# Patient Record
Sex: Female | Born: 2000 | Race: Black or African American | Hispanic: No | Marital: Single | Smoking: Current some day smoker
Health system: Southern US, Community
[De-identification: ages and names within clinical notes are randomized; demographics above are authoritative.]

## PROBLEM LIST (undated history)

## (undated) DIAGNOSIS — Z789 Other specified health status: Secondary | ICD-10-CM

## (undated) HISTORY — PX: NO PAST SURGERIES: SHX2092

---
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
## 2018-03-29 ENCOUNTER — Encounter: Payer: Self-pay | Admitting: Emergency Medicine

## 2018-03-29 ENCOUNTER — Emergency Department
Admission: EM | Admit: 2018-03-29 | Discharge: 2018-03-29 | Disposition: A | Discharge status: Home / Self Care | Payer: Medicaid Other | Attending: Emergency Medicine | Admitting: Emergency Medicine

## 2018-03-29 ENCOUNTER — Other Ambulatory Visit: Payer: Self-pay

## 2018-03-29 DIAGNOSIS — J189 Pneumonia, unspecified organism: Secondary | ICD-10-CM | POA: Insufficient documentation

## 2018-03-29 DIAGNOSIS — M791 Myalgia, unspecified site: Secondary | ICD-10-CM | POA: Diagnosis not present

## 2018-03-29 DIAGNOSIS — R05 Cough: Secondary | ICD-10-CM | POA: Diagnosis not present

## 2018-03-29 DIAGNOSIS — R509 Fever, unspecified: Secondary | ICD-10-CM | POA: Diagnosis present

## 2018-03-29 LAB — INFLUENZA PANEL BY PCR (TYPE A & B)
INFLAPCR: NEGATIVE
INFLBPCR: NEGATIVE

## 2018-03-29 MED ORDER — AZITHROMYCIN 250 MG PO TABS: ORAL_TABLET | ORAL | 0 refills | Status: AC

## 2018-03-29 MED ORDER — PSEUDOEPH-BROMPHEN-DM 30-2-10 MG/5ML PO SYRP: 10.0000 mL | ORAL_SOLUTION | Freq: Four times a day (QID) | ORAL | 0 refills | Status: DC | PRN

## 2018-03-29 NOTE — ED Notes (Signed)
Sick for 2 weeks with c/o of body ache that began yesterday. Here today to r/o flu.

## 2018-03-29 NOTE — ED Triage Notes (Signed)
Cough and fever x 2 weeks 

## 2018-03-29 NOTE — ED Provider Notes (Signed)
[facility] Emergency Department Provider Note  ____________________________________________  Time seen: Approximately 8:25 PM  I have reviewed the triage vital signs and the nursing notes.   HISTORY  Chief Complaint Cough    HPI Yolanda Sutton is a 18 y.o. female who presents the emergency department complaining of fever, cough, body aches.  Patient had viral URI symptoms approximately 2 weeks ago that lasted for a week.  Patient seemed to improve and then developed cough and worsening symptoms.  No difficulty breathing.  No headache, neck pain or stiffness, chest pain, shortness of breath, abdominal pain, nausea vomiting, diarrhea or constipation.  No medications for his complaint currently.  No other complaints at this time.  Patient does have a history of sepsis from pyelonephritis.  No complaints with urinary tract at this time.    Past Medical History:  Diagnosis Date  . Patient denies medical problems     Patient Active Problem List   Diagnosis Date Noted  . Sepsis (HCC) 09/11/2015  . Pyelonephritis 09/11/2015  . Acute pyelonephritis 09/11/2015    Past Surgical History:  Procedure Laterality Date  . NO PAST SURGERIES      Prior to Admission medications   Medication Sig Start Date End Date Taking? Authorizing Provider  amoxicillin (AMOXIL) 500 MG tablet Take 1 tablet (500 mg total) by mouth 2 (two) times daily. 12/09/15   Beers, Charmayne Sheer, PA-C  azithromycin (ZITHROMAX Z-PAK) 250 MG tablet Take 2 tablets (500 mg) on  Day 1,  followed by 1 tablet (250 mg) once daily on Days 2 through 5. 03/29/18   Kadar Chance, Delorise Royals, PA-C  brompheniramine-pseudoephedrine-DM 30-2-10 MG/5ML syrup Take 10 mLs by mouth 4 (four) times daily as needed. 03/29/18   Cynthia Cogle, Delorise Royals, PA-C  diphenhydrAMINE (BENADRYL) 25 mg capsule Take 1 capsule (25 mg total) by mouth every 4 (four) hours as needed. 07/27/16 07/27/17  Little, Jordan Likes, PA-C    Allergies Patient has no  known allergies.  No family history on file.  Social History Social History   Tobacco Use  . Smoking status: Never Smoker  . Smokeless tobacco: Never Used  Substance Use Topics  . Alcohol use: No  . Drug use: No     Review of Systems  Constitutional: Subjective fever/chills Eyes: No visual changes. No discharge ENT: Minimal nasal congestion Cardiovascular: no chest pain. Respiratory: Positive cough. No SOB. Gastrointestinal: No abdominal pain.  No nausea, no vomiting.  No diarrhea.  No constipation. Musculoskeletal: Negative for musculoskeletal pain. Skin: Negative for rash, abrasions, lacerations, ecchymosis. Neurological: Negative for headaches, focal weakness or numbness. 10-point ROS otherwise negative.  ____________________________________________   PHYSICAL EXAM:  VITAL SIGNS: ED Triage Vitals  Enc Vitals Group     BP 03/29/18 1703 137/72     Pulse Rate 03/29/18 1703 (!) 110     Resp 03/29/18 1703 20     Temp 03/29/18 1703 99.2 F (37.3 C)     Temp Source 03/29/18 1703 Oral     SpO2 03/29/18 1703 99 %     Weight 03/29/18 1704 80 lb (36.3 kg)     Height 03/29/18 1704 4\' 10"  (1.473 m)     Head Circumference --      Peak Flow --      Pain Score 03/29/18 1703 7     Pain Loc --      Pain Edu? --      Excl. in GC? --      Constitutional: Alert and oriented.  Well appearing and in no acute distress. Eyes: Conjunctivae are normal. PERRL. EOMI. Head: Atraumatic. ENT:      Ears: EACs and TMs unremarkable bilaterally.      Nose: Mild clear congestion/rhinnorhea.      Mouth/Throat: Mucous membranes are moist.  Oropharynx is nonerythematous and nonedematous.  Uvula is midline. Neck: No stridor.  Neck is supple full range of motion Hematological/Lymphatic/Immunilogical: No cervical lymphadenopathy. Cardiovascular: Normal rate, regular rhythm. Normal S1 and S2.  Good peripheral circulation. Respiratory: Normal respiratory effort without tachypnea or retractions.  Lungs with a few scattered coarse breath sounds.  No inspiratory or expiratory wheezing.  No rales or rhonchi.Peri Jefferson air entry to the bases with no decreased or absent breath sounds. Musculoskeletal: Full range of motion to all extremities. No gross deformities appreciated. Neurologic:  Normal speech and language. No gross focal neurologic deficits are appreciated.  Skin:  Skin is warm, dry and intact. No rash noted. Psychiatric: Mood and affect are normal. Speech and behavior are normal. Patient exhibits appropriate insight and judgement.   ____________________________________________   LABS (all labs ordered are listed, but only abnormal results are displayed)  Labs Reviewed  INFLUENZA PANEL BY PCR (TYPE A & B)   ____________________________________________  EKG   ____________________________________________  RADIOLOGY   No results found.  ____________________________________________    PROCEDURES  Procedure(s) performed:    Procedures    Medications - No data to display   ____________________________________________   INITIAL IMPRESSION / ASSESSMENT AND PLAN / ED COURSE  Pertinent labs & imaging results that were available during my care of the patient were reviewed by me and considered in my medical decision making (see chart for details).  Review of the [place] CSRS was performed in accordance of the [facility] prior to dispensing any controlled drugs.      Patient's diagnosis is consistent with community-acquired pneumonia.  Patient presented to the emergency department complaining of worsening symptoms after 2 weeks of URI symptoms.  On exam, patient was overall well-appearing.  No influenza on testing.  Given patient's symptoms of viral URI symptoms, seemingly improved then worsen I suspect mild community-acquired pneumonia.  Patient will be treated with a Z-Pak and Bromfed cough syrup.  Follow-up with primary care as needed..  Patient is given ED precautions to  return to the ED for any worsening or new symptoms.     ____________________________________________  FINAL CLINICAL IMPRESSION(S) / ED DIAGNOSES  Final diagnoses:  Community acquired pneumonia, unspecified laterality      NEW MEDICATIONS STARTED DURING THIS VISIT:  ED Discharge Orders         Ordered    azithromycin (ZITHROMAX Z-PAK) 250 MG tablet     03/29/18 2027    brompheniramine-pseudoephedrine-DM 30-2-10 MG/5ML syrup  4 times daily PRN     03/29/18 2027              This chart was dictated using voice recognition software/Dragon. Despite best efforts to proofread, errors can occur which can change the meaning. Any change was purely unintentional.    Racheal Patches, PA-C 03/29/18 2029    Emily Filbert, MD 03/29/18 2228

## 2018-12-25 IMAGING — US US OB TRANSVAGINAL
1 series · 14 of 28 positions shown · non-contrast
Comparison: None.

CLINICAL DATA: 15-year-old pregnant female with pelvic pain and
bleeding. Estimated gestational age of 10 weeks 4 days by LMP. Beta
HCG of [DATE].

EXAM:
OBSTETRIC <14 WK US AND TRANSVAGINAL OB US
TECHNIQUE: Both transabdominal and transvaginal ultrasound examinations were
performed for complete evaluation of the gestation as well as the
maternal uterus, adnexal regions, and pelvic cul-de-sac.
Transvaginal technique was performed to assess early pregnancy.

[Series 1: us ob transvaginal · 0.22mm/px · 14 of 92 slices shown]
[im 4/92]
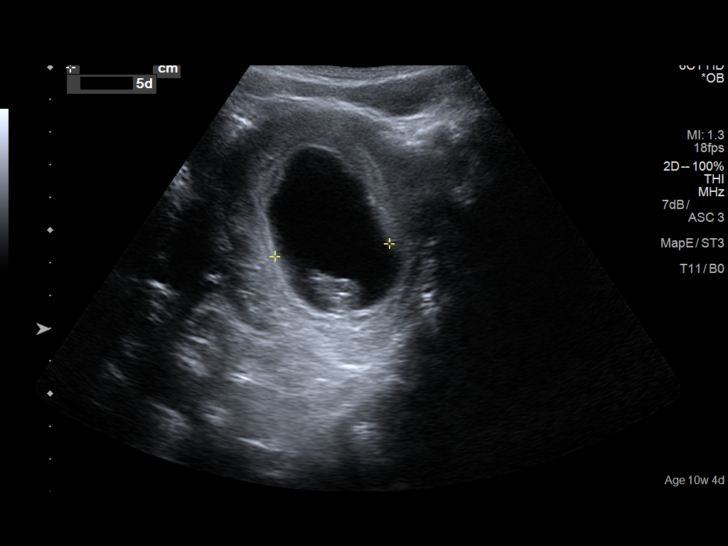
[im 11/92]
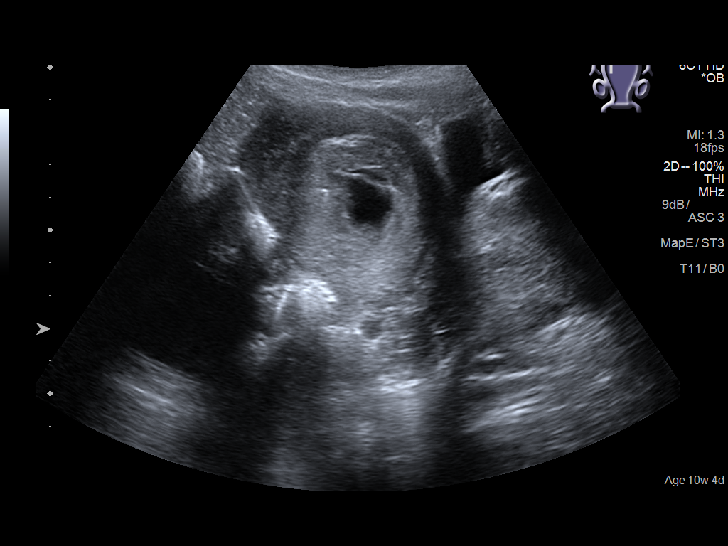
[im 17/92]
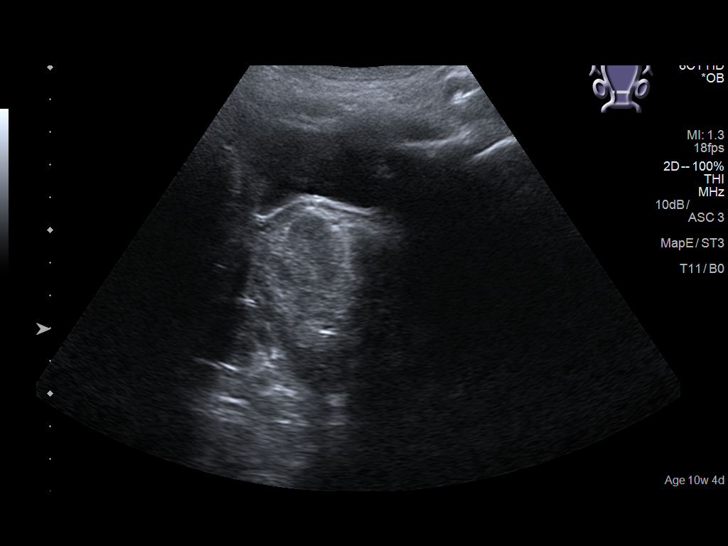
[im 24/92]
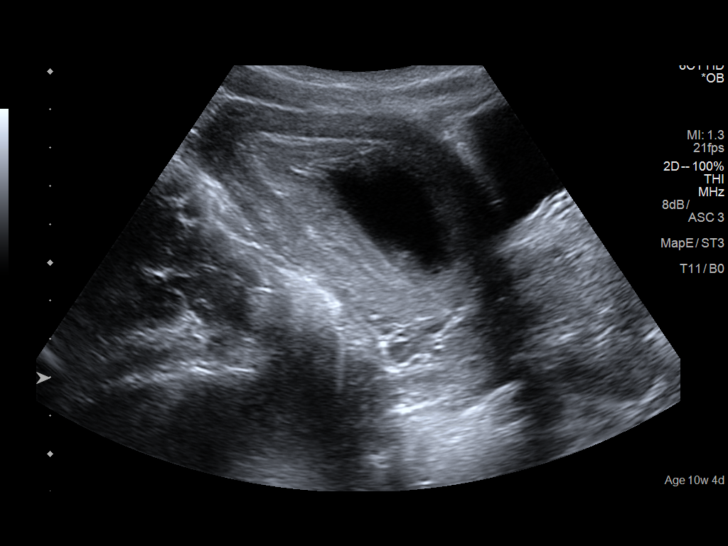
[im 31/92]
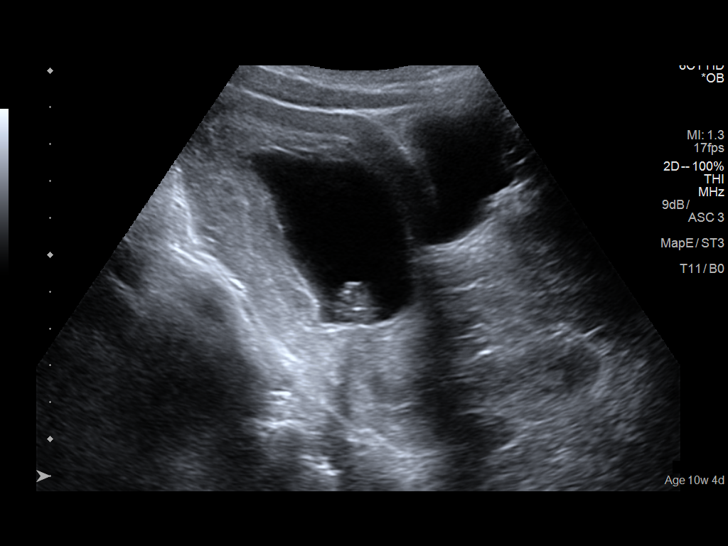
[im 38/92]
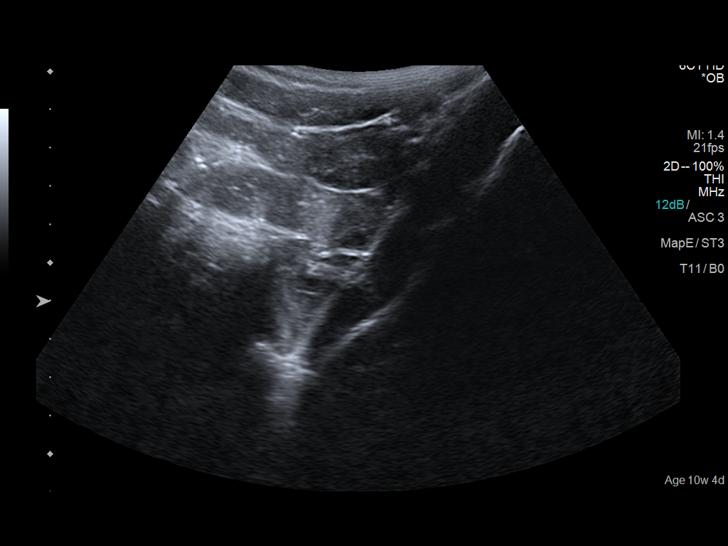
[im 44/92]
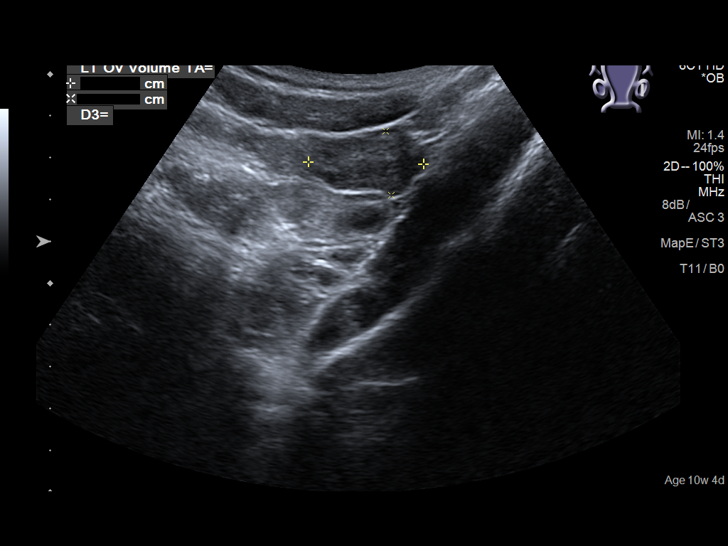
[im 51/92]
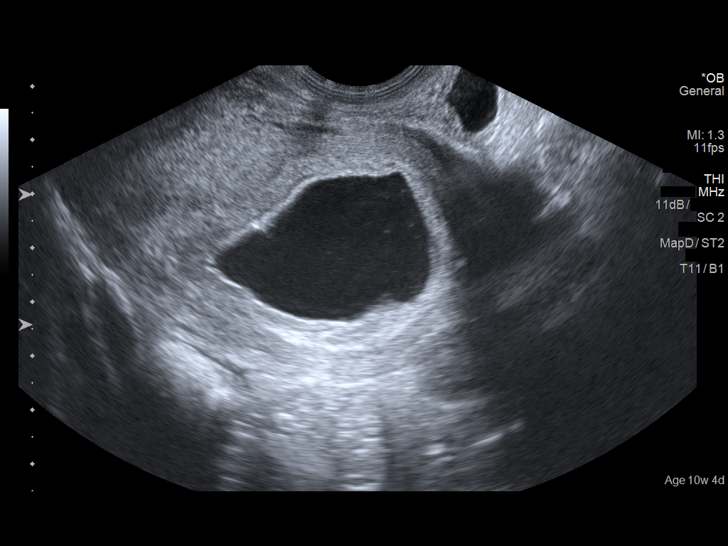
[im 58/92]
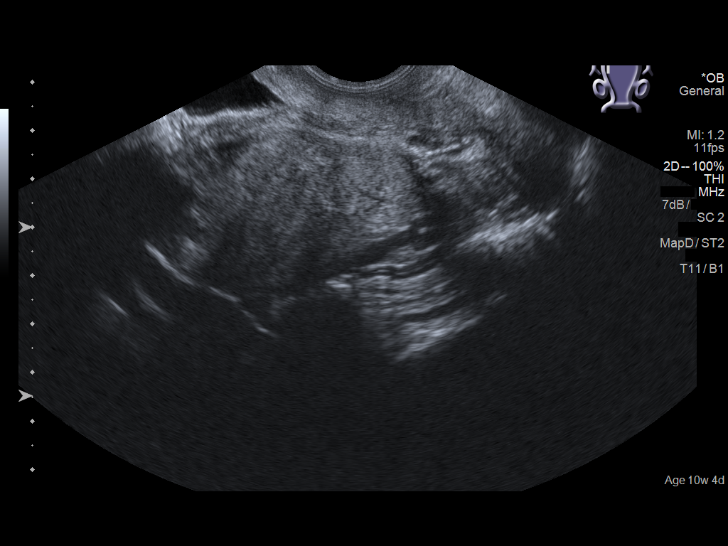
[im 65/92]
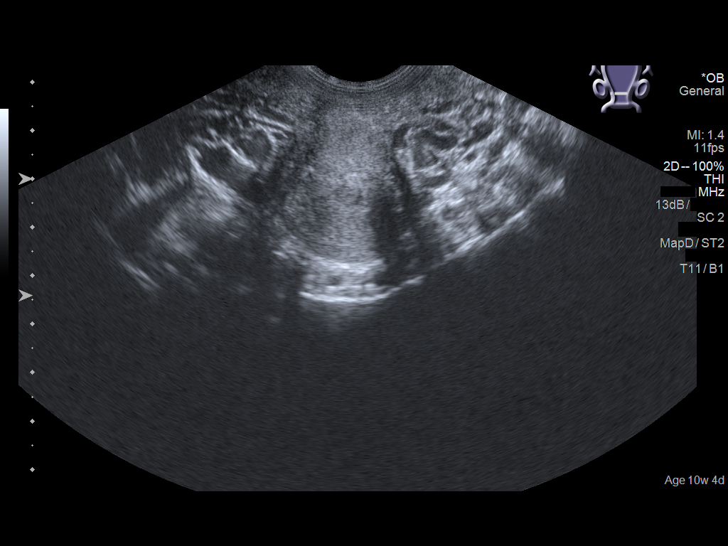
[im 71/92]
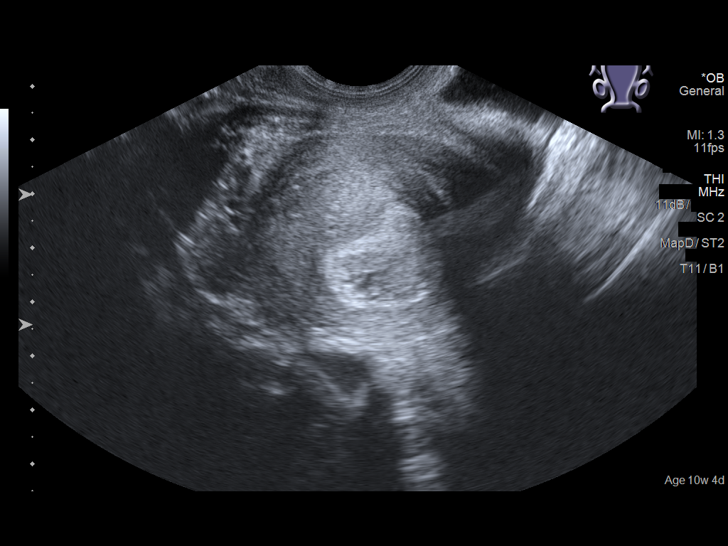
[im 78/92]
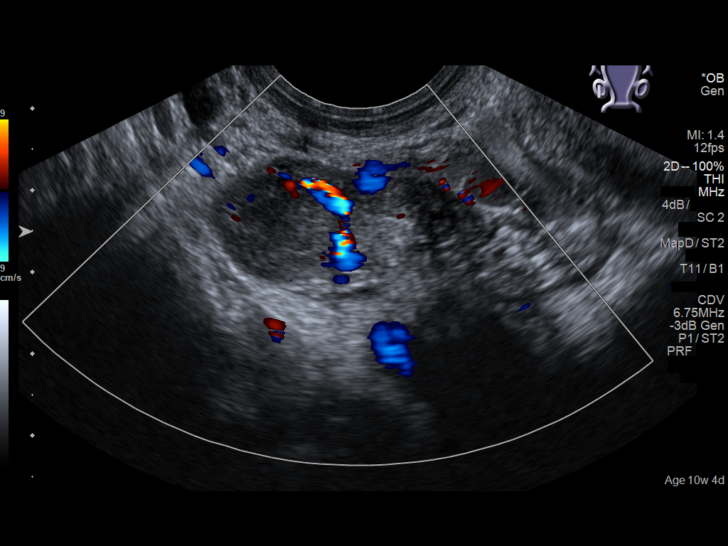
[im 85/92]
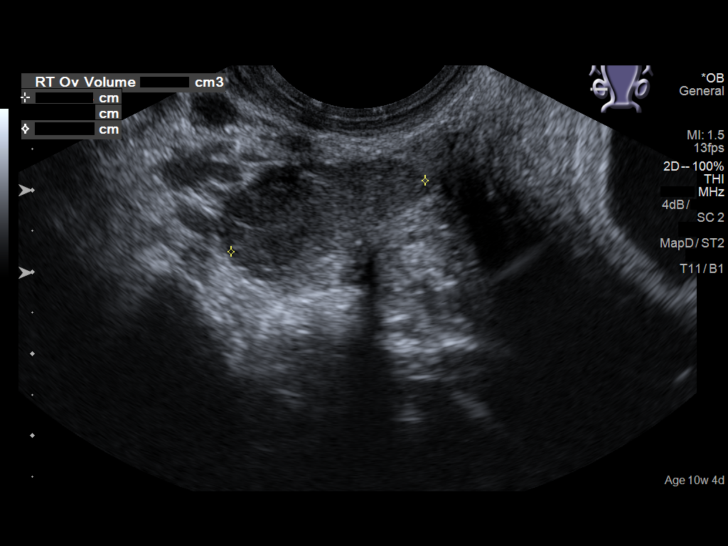
[im 92/92]
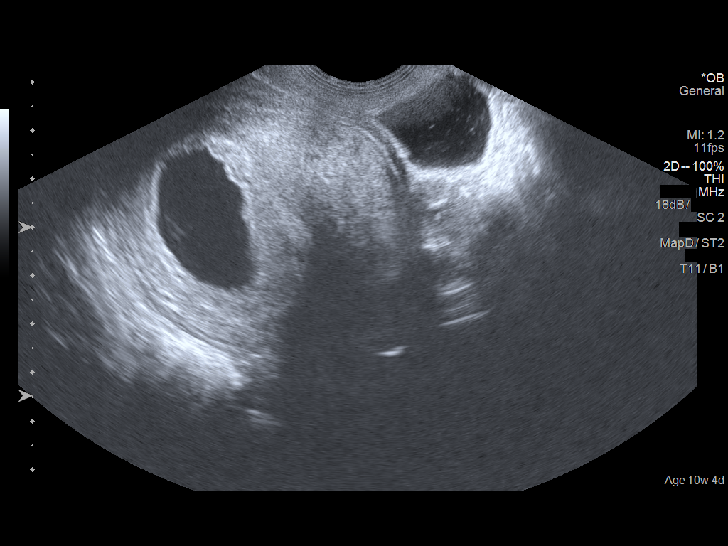

[14 of 28 positions shown; findings below may reference images not displayed]

FINDINGS: Intrauterine gestational sac: Single

Yolk sac:  Not Visualized.

Embryo:  Visualized.

Cardiac Activity: Not Visualized.

CRL:  22  mm   8 w   0 d

Subchorionic hemorrhage:  None visualized.

Maternal uterus/adnexae: No significant ovarian abnormalities.

No free fluid or adnexal mass.
IMPRESSION: Single intrauterine gestation with crown-rump length of 22 mm and no
cardiac activity, compatible with fetal demise/nonviable pregnancy.

Results were called by telephone at the time of interpretation on
08/31/2016 at [DATE] to Dr. JAMILYA HOLDEN , who verbally
acknowledged these results.

## 2019-03-02 ENCOUNTER — Emergency Department
Admission: EM | Admit: 2019-03-02 | Discharge: 2019-03-02 | Discharge status: Against Medical Advice | Payer: Medicare Other

## 2019-10-31 ENCOUNTER — Encounter: Payer: Self-pay | Admitting: Intensive Care

## 2019-10-31 ENCOUNTER — Other Ambulatory Visit: Payer: Self-pay

## 2019-10-31 ENCOUNTER — Emergency Department
Admission: EM | Admit: 2019-10-31 | Discharge: 2019-10-31 | Disposition: A | Discharge status: Home / Self Care | Payer: Medicare Other | Attending: Emergency Medicine | Admitting: Emergency Medicine

## 2019-10-31 DIAGNOSIS — J029 Acute pharyngitis, unspecified: Secondary | ICD-10-CM | POA: Diagnosis not present

## 2019-10-31 DIAGNOSIS — J069 Acute upper respiratory infection, unspecified: Secondary | ICD-10-CM

## 2019-10-31 DIAGNOSIS — R0981 Nasal congestion: Secondary | ICD-10-CM | POA: Diagnosis present

## 2019-10-31 DIAGNOSIS — Z79899 Other long term (current) drug therapy: Secondary | ICD-10-CM | POA: Diagnosis not present

## 2019-10-31 DIAGNOSIS — F17290 Nicotine dependence, other tobacco product, uncomplicated: Secondary | ICD-10-CM | POA: Diagnosis not present

## 2019-10-31 DIAGNOSIS — Z20822 Contact with and (suspected) exposure to covid-19: Secondary | ICD-10-CM | POA: Diagnosis not present

## 2019-10-31 LAB — GROUP A STREP BY PCR: Group A Strep by PCR: NOT DETECTED

## 2019-10-31 LAB — SARS CORONAVIRUS 2 BY RT PCR (HOSPITAL ORDER, PERFORMED IN ~~LOC~~ HOSPITAL LAB): SARS Coronavirus 2: NEGATIVE

## 2019-10-31 NOTE — ED Triage Notes (Signed)
Pt c/o loss of taste/smell, sore throat, and nasal congestion. Reports reports having covid test Thursday with no results yet

## 2019-10-31 NOTE — ED Provider Notes (Signed)
[facility] Emergency Department Provider Note  ____________________________________________   First MD Initiated Contact with Patient 10/31/19 1932     (approximate)  I have reviewed the triage vital signs and the nursing notes.   HISTORY  Chief Complaint Nasal Congestion   HPI Brittish Dimichele is a 19 y.o. female resents to the emergency department for evaluation of upper respiratory illness.  The patient states that she was seen at her primary care on Thursday and had no symptoms yet was tested for COVID-19.  She says she does not have the results of this test.  She states that she became symptomatic yesterday with a sore throat, nasal congestion, cough.  He had Covid back in January 2021.  She has not been vaccinated.  She states her mom was seen at this facility this week and was diagnosed with strep throat.  She states that her sore throat has improved gradually over the last 24 hours and her largest complaint is nasal congestion with sneeze.         Past Medical History:  Diagnosis Date  . Patient denies medical problems     Patient Active Problem List   Diagnosis Date Noted  . Sepsis (HCC) 09/11/2015  . Pyelonephritis 09/11/2015  . Acute pyelonephritis 09/11/2015    Past Surgical History:  Procedure Laterality Date  . NO PAST SURGERIES      Prior to Admission medications   Medication Sig Start Date End Date Taking? Authorizing Provider  amoxicillin (AMOXIL) 500 MG tablet Take 1 tablet (500 mg total) by mouth 2 (two) times daily. 12/09/15   Beers, Charmayne Sheer, PA-C  azithromycin (ZITHROMAX Z-PAK) 250 MG tablet Take 2 tablets (500 mg) on  Day 1,  followed by 1 tablet (250 mg) once daily on Days 2 through 5. 03/29/18   Cuthriell, Delorise Royals, PA-C  brompheniramine-pseudoephedrine-DM 30-2-10 MG/5ML syrup Take 10 mLs by mouth 4 (four) times daily as needed. 03/29/18   Cuthriell, Delorise Royals, PA-C  diphenhydrAMINE (BENADRYL) 25 mg capsule Take 1 capsule  (25 mg total) by mouth every 4 (four) hours as needed. 07/27/16 07/27/17  Little, Jordan Likes, PA-C    Allergies Patient has no known allergies.  History reviewed. No pertinent family history.  Social History Social History   Tobacco Use  . Smoking status: Current Some Day Smoker    Types: Cigars  . Smokeless tobacco: Never Used  Substance Use Topics  . Alcohol use: Yes    Comment: rare  . Drug use: No    Review of Systems Constitutional: No fever/chills Eyes: No visual changes. ENT: + sore throat, + nasal congestion Cardiovascular: Denies chest pain. Respiratory: Denies shortness of breath. + Cough Gastrointestinal: No abdominal pain.  No nausea, no vomiting.  No diarrhea.  No constipation. Genitourinary: Negative for dysuria. Musculoskeletal: Negative for back pain. Skin: Negative for rash. Neurological: Negative for headaches, focal weakness or numbness.   ____________________________________________   PHYSICAL EXAM:  VITAL SIGNS: ED Triage Vitals  Enc Vitals Group     BP 10/31/19 1753 122/76     Pulse Rate 10/31/19 1753 88     Resp 10/31/19 1753 16     Temp 10/31/19 1753 98.8 F (37.1 C)     Temp Source 10/31/19 1753 Oral     SpO2 10/31/19 1753 99 %     Weight 10/31/19 1753 100 lb (45.4 kg)     Height 10/31/19 1753 5' (1.524 m)     Head Circumference --  Peak Flow --      Pain Score 10/31/19 1801 0     Pain Loc --      Pain Edu? --      Excl. in GC? --     Constitutional: Alert and oriented. Well appearing and in no acute distress. Eyes: Conjunctivae are normal. PERRL. EOMI. Head: Atraumatic. Nose: Mild congestion Ears: The bilateral TMs are only partially visualized secondary to cerumen on the inferior aspect.  The visualized superior aspect of both TMs appears nonerythematous. Mouth/Throat: Mucous membranes are moist.  Oropharynx erythematous without exudate Neck: No stridor.   Lymphatic: There is anterior cervical lymphadenopathy more prominent on  the left than right Cardiovascular: Normal rate, regular rhythm. Grossly normal heart sounds.  Good peripheral circulation. Respiratory: Normal respiratory effort.  No retractions. Lungs CTAB. Gastrointestinal: Soft and nontender. No distention. No abdominal bruits. No CVA tenderness. Musculoskeletal: No lower extremity tenderness nor edema.  No joint effusions. Neurologic:  Normal speech and language. No gross focal neurologic deficits are appreciated. No gait instability. Skin:  Skin is warm, dry and intact. No rash noted. Psychiatric: Mood and affect are normal. Speech and behavior are normal.  ____________________________________________   LABS (all labs ordered are listed, but only abnormal results are displayed)  Labs Reviewed  SARS CORONAVIRUS 2 BY RT PCR (HOSPITAL ORDER, PERFORMED IN [facility])  GROUP A STREP BY PCR    ____________________________________________   INITIAL IMPRESSION / ASSESSMENT AND PLAN / ED COURSE  As part of my medical decision making, I reviewed the following data within the electronic MEDICAL RECORD NUMBER Nursing notes reviewed and incorporated        Riya Bolte is a 19 year old female who presents to the emergency department for evaluation of upper respiratory illness.  She had previous infection of Covid approximately 9 months ago but has not been vaccinated in the interim.  She also states that her mother was tested earlier this week for strep and tested positive.  On physical exam, the patient does have some mild nasal congestion as well as erythematous oropharynx without exudate.  Her physical exam is otherwise unremarkable.  Given the prevalence of Covid in the community particularly with new strains of the virus, will test for COVID-19 infection.  We will also test the patient for strep pharyngitis given her mother's positive test.  Discussed with the patient that we do not have to keep her here for these results to come back.  She  will be called this evening when the results return.  Patient acknowledges that she will have to go on an antibiotic if she test positive for strep.  Otherwise supportive symptomatic treatment was discussed with over-the-counter occasions.  The patient was also given a work note which she requested.  Melika Kyer was evaluated in Emergency Department on 10/31/2019 for the symptoms described in the history of present illness. She was evaluated in the context of the global COVID-19 pandemic, which necessitated consideration that the patient might be at risk for infection with the SARS-CoV-2 virus that causes COVID-19. Institutional protocols and algorithms that pertain to the evaluation of patients at risk for COVID-19 are in a state of rapid change based on information released by regulatory bodies including the CDC and federal and state organizations. These policies and algorithms were followed during the patient's care in the ED.       ____________________________________________   FINAL CLINICAL IMPRESSION(S) / ED DIAGNOSES  Final diagnoses:  Viral upper respiratory tract infection  Sore  throat     ED Discharge Orders    None       Note:  This document was prepared using Dragon voice recognition software and may include unintentional dictation errors.    Lucy Chris, PA 10/31/19 2027    Delton Prairie, MD 10/31/19 2351

## 2019-12-06 ENCOUNTER — Other Ambulatory Visit: Payer: Self-pay

## 2019-12-06 ENCOUNTER — Emergency Department
Admission: EM | Admit: 2019-12-06 | Discharge: 2019-12-06 | Disposition: A | Discharge status: Home / Self Care | Payer: Medicare Other | Attending: Emergency Medicine | Admitting: Emergency Medicine

## 2019-12-06 DIAGNOSIS — R509 Fever, unspecified: Secondary | ICD-10-CM | POA: Diagnosis present

## 2019-12-06 DIAGNOSIS — Z20822 Contact with and (suspected) exposure to covid-19: Secondary | ICD-10-CM | POA: Diagnosis not present

## 2019-12-06 DIAGNOSIS — J069 Acute upper respiratory infection, unspecified: Secondary | ICD-10-CM | POA: Insufficient documentation

## 2019-12-06 DIAGNOSIS — F17290 Nicotine dependence, other tobacco product, uncomplicated: Secondary | ICD-10-CM | POA: Insufficient documentation

## 2019-12-06 LAB — RESPIRATORY PANEL BY RT PCR (FLU A&B, COVID)
Influenza A by PCR: NEGATIVE
Influenza B by PCR: NEGATIVE
SARS Coronavirus 2 by RT PCR: NEGATIVE

## 2019-12-06 MED ORDER — PSEUDOEPH-BROMPHEN-DM 30-2-10 MG/5ML PO SYRP: 5.0000 mL | ORAL_SOLUTION | Freq: Three times a day (TID) | ORAL | 0 refills | Status: AC | PRN

## 2019-12-06 NOTE — ED Provider Notes (Signed)
[facility] Emergency Department Provider Note   ____________________________________________   First MD Initiated Contact with Patient 12/06/19 1349     (approximate)  I have reviewed the triage vital signs and the nursing notes.   HISTORY  Chief Complaint Fever and Cough   HPI Yolanda [place] is a 19 y.o. female reports to the emergency department for complaint of fever with cough since Friday.  She states that she had a fever of 99-100 on Friday and has had an intermittent cough since then.  She works at the [employer] in [place] and has had a lot of community exposure.  Of note, the patient already tested positive for Covid in [place] 2021 and has not been vaccinated since that time.  She was seen approximately a month ago in the emergency room for similar symptoms and had full resolution of those symptoms for the last 3 weeks prior to new onset.  She has not been doing anything to treat her symptoms.         Past Medical History:  Diagnosis Date  . Patient denies medical problems     Patient Active Problem List   Diagnosis Date Noted  . Sepsis (HCC) 09/11/2015  . Pyelonephritis 09/11/2015  . Acute pyelonephritis 09/11/2015    Past Surgical History:  Procedure Laterality Date  . NO PAST SURGERIES      Prior to Admission medications   Medication Sig Start Date End Date Taking? Authorizing Provider  amoxicillin (AMOXIL) 500 MG tablet Take 1 tablet (500 mg total) by mouth 2 (two) times daily. 12/09/15   Beers, Charmayne Sheer, PA-C  azithromycin (ZITHROMAX Z-PAK) 250 MG tablet Take 2 tablets (500 mg) on  Day 1,  followed by 1 tablet (250 mg) once daily on Days 2 through 5. 03/29/18   Cuthriell, Delorise Royals, PA-C  brompheniramine-pseudoephedrine-DM 30-2-10 MG/5ML syrup Take 5 mLs by mouth 3 (three) times daily as needed. 12/06/19   Lucy Chris, PA  diphenhydrAMINE (BENADRYL) 25 mg capsule Take 1 capsule (25 mg total) by mouth every 4 (four) hours as  needed. 07/27/16 07/27/17  Little, Jordan Likes, PA-C    Allergies Patient has no known allergies.  History reviewed. No pertinent family history.  Social History Social History   Tobacco Use  . Smoking status: Current Some Day Smoker    Types: Cigars  . Smokeless tobacco: Never Used  Substance Use Topics  . Alcohol use: Yes    Comment: rare  . Drug use: No    Review of Systems Constitutional: + fever/chills Eyes: No visual changes. ENT: No sore throat. Cardiovascular: Denies chest pain. Respiratory: + Cough, denies shortness of breath. Gastrointestinal: No abdominal pain.  No nausea, no vomiting.  No diarrhea.  No constipation. Genitourinary: Negative for dysuria. Musculoskeletal: Negative for back pain. Skin: Negative for rash. Neurological: Negative for headaches, focal weakness or numbness. ____________________________________________   PHYSICAL EXAM:  VITAL SIGNS: ED Triage Vitals  Enc Vitals Group     BP 12/06/19 1224 127/78     Pulse Rate 12/06/19 1224 80     Resp 12/06/19 1224 16     Temp 12/06/19 1224 98.5 F (36.9 C)     Temp Source 12/06/19 1224 Oral     SpO2 12/06/19 1224 100 %     Weight 12/06/19 1226 101 lb 6.6 oz (46 kg)     Height 12/06/19 1226 5' (1.524 m)     Head Circumference --      Peak Flow --  Pain Score 12/06/19 1226 0     Pain Loc --      Pain Edu? --      Excl. in [place]? --     Constitutional: Alert and oriented. Well appearing and in no acute distress. Eyes: Conjunctivae are normal. PERRL. EOMI. Head: Atraumatic. Nose: No congestion/rhinnorhea. Mouth/Throat: Mucous membranes are moist.  Oropharynx non-erythematous. Neck: No stridor.   Lymphatic: No cervical lymphadenopathy.   Cardiovascular: Normal rate, regular rhythm. Grossly normal heart sounds.  Good peripheral circulation. Respiratory: Normal respiratory effort.  No retractions. Lungs CTAB. No W/R/R Gastrointestinal: Soft and nontender. No distention. No abdominal bruits. No  CVA tenderness. Musculoskeletal: No lower extremity tenderness nor edema.  No joint effusions. Neurologic:  Normal speech and language. No gross focal neurologic deficits are appreciated. No gait instability. Skin:  Skin is warm, dry and intact. No rash noted. Psychiatric: Mood and affect are normal. Speech and behavior are normal.  ____________________________________________   LABS (all labs ordered are listed, but only abnormal results are displayed)  Labs Reviewed  RESPIRATORY PANEL BY RT PCR (FLU A&B, COVID)   ____________________________________________   INITIAL IMPRESSION / ASSESSMENT AND PLAN / ED COURSE  As part of my medical decision making, I reviewed the following data within the electronic MEDICAL RECORD NUMBER Nursing notes reviewed and incorporated        Yolanda [place] is a 19 year old female who presents to the emergency department duration of URI symptoms with fever and cough since Friday.  She has not been treating her symptoms up until this time.  She was seen a little over a month ago with similar symptoms and had a full recovery prior to these onset.  She is afebrile in triage today and vitals are reassuring.  Physical exam also has no positive findings.  I discussed with her that her symptoms are most consistent with a viral URI.  We will provide the patient a Covid test which she will need to return to work.  We will also provide her a work note for the next 2 days.  I will prescribe her some Bromfed that she can use for her cough as needed.  The patient is amenable with this plan.  She understands she should return to the emergency department if she has any worsening.      ____________________________________________   FINAL CLINICAL IMPRESSION(S) / ED DIAGNOSES  Final diagnoses:  Viral upper respiratory tract infection  Suspected COVID-19 virus infection     ED Discharge Orders         Ordered    brompheniramine-pseudoephedrine-DM 30-2-10 MG/5ML syrup   3 times daily PRN        12/06/19 1415          *Please note:  Yolanda [place] was evaluated in Emergency Department on 12/06/2019 for the symptoms described in the history of present illness. She was evaluated in the context of the global COVID-19 pandemic, which necessitated consideration that the patient might be at risk for infection with the SARS-CoV-2 virus that causes COVID-19. Institutional protocols and algorithms that pertain to the evaluation of patients at risk for COVID-19 are in a state of rapid change based on information released by regulatory bodies including the CDC and federal and state organizations. These policies and algorithms were followed during the patient's care in the ED.  Some ED evaluations and interventions may be delayed as a result of limited staffing during and the pandemic.*   Note:  This document was prepared using Dragon voice  recognition software and may include unintentional dictation errors.    Lucy Chris, PA 12/06/19 1620    Shaune Pollack, MD 12/06/19 2135

## 2019-12-06 NOTE — ED Triage Notes (Signed)
Pt ambulatory to triage c/o fever and cough since Friday. NAD noted at this time

## 2019-12-06 NOTE — Discharge Instructions (Signed)
Please access your mychart for your covid test results.

## 2021-12-09 ENCOUNTER — Emergency Department
Admission: EM | Admit: 2021-12-09 | Discharge: 2021-12-09 | Disposition: A | Discharge status: Home / Self Care | Payer: Medicare Other | Attending: Emergency Medicine | Admitting: Emergency Medicine

## 2021-12-09 ENCOUNTER — Other Ambulatory Visit: Payer: Self-pay

## 2021-12-09 ENCOUNTER — Emergency Department: Payer: Medicare Other

## 2021-12-09 DIAGNOSIS — J069 Acute upper respiratory infection, unspecified: Secondary | ICD-10-CM | POA: Diagnosis not present

## 2021-12-09 DIAGNOSIS — R059 Cough, unspecified: Secondary | ICD-10-CM | POA: Diagnosis present

## 2021-12-09 DIAGNOSIS — Z20822 Contact with and (suspected) exposure to covid-19: Secondary | ICD-10-CM | POA: Diagnosis not present

## 2021-12-09 LAB — SARS CORONAVIRUS 2 BY RT PCR: SARS Coronavirus 2 by RT PCR: NEGATIVE

## 2021-12-09 MED ORDER — BENZONATATE 100 MG PO CAPS: 100.0000 mg | ORAL_CAPSULE | Freq: Three times a day (TID) | ORAL | 0 refills | Status: AC | PRN

## 2021-12-09 NOTE — Discharge Instructions (Addendum)
-  You may take Tylenol/Ibuprofen as needed for fever/body aches.  -You may take the benzonatate as needed for cough.  -Follow-up with your primary care provider as needed if your symptoms fail to improve after 3 to 5 days.  -Return to the emergency department anytime if you begin to experience any new or worsening symptoms.

## 2021-12-09 NOTE — ED Provider Notes (Signed)
[facility] Provider Note    None    (approximate)   History   Chief Complaint No chief complaint on file.   HPI Yolanda [place] is a 21 y.o. female, no significant medical history, presents emergency department for evaluation of flulike symptoms.  Patient endorses cough, congestion, and sore throat x3 days.  Denies any recent sick contacts.  Denies any body aches or chest pain/shortness of breath.  Denies nausea/vomiting, dysuria, numb/tingling upper or lower extremities, dizziness/lightheadedness, headache, or rash/lesions.  History Limitations: No limitations.        Physical Exam  Triage Vital Signs: ED Triage Vitals  Enc Vitals Group     BP 12/09/21 1556 (!) 148/96     Pulse Rate 12/09/21 1556 (!) 122     Resp 12/09/21 1556 18     Temp 12/09/21 1556 97.8 F (36.6 C)     Temp Source 12/09/21 1556 Oral     SpO2 12/09/21 1556 99 %     Weight --      Height --      Head Circumference --      Peak Flow --      Pain Score 12/09/21 1557 0     Pain Loc --      Pain Edu? --      Excl. in [place]? --     Most recent vital signs: Vitals:   12/09/21 1556  BP: (!) 148/96  Pulse: (!) 122  Resp: 18  Temp: 97.8 F (36.6 C)  SpO2: 99%    General: Awake, NAD.  Skin: Warm, dry. No rashes or lesions.  Eyes: PERRL. Conjunctivae normal.  CV: Good peripheral perfusion.  Resp: Normal effort.  Lung sounds are clear bilaterally in the apices and bases. Abd: Soft, non-tender. No distention.  Neuro: At baseline. No gross neurological deficits.  Musculoskeletal: Normal ROM of all extremities.  Focused Exam: Throat exam unremarkable.  Uvula midline.  Physical Exam    ED Results / Procedures / Treatments  Labs (all labs ordered are listed, but only abnormal results are displayed) Labs Reviewed  SARS CORONAVIRUS 2 BY RT PCR  RESP PANEL BY RT-PCR (FLU A&B, COVID) [id number]     EKG N/A.    RADIOLOGY  ED Provider Interpretation: I personally  viewed and interpreted this x-ray, no acute abnormalities.  DG Chest 2 View  Result Date: 12/09/2021 CLINICAL DATA:  Reconfirmed nipple shadow opacity. EXAM: CHEST - 2 VIEW COMPARISON:  Chest x-ray 12/09/2021 FINDINGS: The heart size and mediastinal contours are within normal limits. Both lungs are clear. No nodular densities are present. The visualized skeletal structures are unremarkable. IMPRESSION: No active cardiopulmonary disease. Electronically Signed   By: Ronney Asters M.D.   On: 12/09/2021 19:13   DG Chest 2 View  Result Date: 12/09/2021 CLINICAL DATA:  Cough and flu like symptoms. EXAM: CHEST - 2 VIEW COMPARISON:  02/20/2009 FINDINGS: The lungs are clear without focal pneumonia, edema, pneumothorax or pleural effusion. Nodular density projects over the right lower lobe, likely a nipple shadow. A flame shaped nodular density is seen over the left upper lobe. Markedly well-defined margins suggests that this may be external to the patient. The cardiopericardial silhouette is within normal limits for size. The visualized bony structures of the thorax are unremarkable. IMPRESSION: No acute cardiopulmonary findings. Nodular opacity over the right lower lobe is likely a nipple shadow. Repeat frontal radiograph with nipple markers recommended to confirm. Given the associated flame shaped opacity over the left  upper lobe which may be external to the patient (either posteriorly or anteriorly), physical inspection suggested with removal of any patient personal clothing prior to repeat x-ray. Electronically Signed   By: Kennith Center M.D.   On: 12/09/2021 16:26    PROCEDURES:  Critical Care performed: N/A.  Procedures    MEDICATIONS ORDERED IN ED: Medications - No data to display   IMPRESSION / MDM / ASSESSMENT AND PLAN / ED COURSE  I reviewed the triage vital signs and the nursing notes.                              Differential diagnosis includes, but is not limited to, COVID-19,  influenza, viral URI, commune acquired pneumonia.  Assessment/Plan Patient presents with cough, congestion, and sore throat x3 days.  COVID-19 PCR negative.  Chest x-ray shows no evidence of pneumonia at this time.  She appears well clinically.  Suspect likely viral URI.  Encouraged her to continue taking over-the-counter medications as needed to manage her symptoms.  We will provide her with a prescription for benzonatate to help her with her cough, which she says is her most bothersome symptom.  Recommend that she follow-up with her primary care provider if her symptoms fail to improve after 4 to 5 days.  Will discharge.  Provided the patient with anticipatory guidance, return precautions, and educational material. Encouraged the patient to return to the emergency department at any time if they begin to experience any new or worsening symptoms. Patient expressed understanding and agreed with the plan.   Patient's presentation is most consistent with acute complicated illness / injury requiring diagnostic workup.       FINAL CLINICAL IMPRESSION(S) / ED DIAGNOSES   Final diagnoses:  Viral URI with cough     Rx / DC Orders   ED Discharge Orders          Ordered    benzonatate (TESSALON PERLES) 100 MG capsule  3 times daily PRN        12/09/21 1819             Note:  This document was prepared using Dragon voice recognition software and may include unintentional dictation errors.   Varney Daily, [place] 12/09/21 1919    Corena Herter, MD 12/09/21 1932

## 2021-12-09 NOTE — ED Triage Notes (Signed)
Pt to ED via POV from home. Pt ambulatory to triage. Pt reports "cold like" symptoms for a few days. Pt denies N/V/D or fever.

## 2021-12-09 NOTE — ED Notes (Signed)
Pt Dc to home. Dc instructions reviewed with all questions answered. Pt verbalizes understanding. Pt ambulatory out of dept with steady gait 

## 2021-12-09 NOTE — ED Provider Triage Note (Signed)
Emergency Medicine Provider Triage Evaluation Note  Yolanda [place] , a 21 y.o. female  was evaluated in triage.  Pt complains of flu like symptoms. No recent sick contacts. No GI or urinary complaints. URI symptoms with cough.  Review of Systems  Positive: Cough, congestion, body aches Negative: Fever, chills, emesis, diarrhea, urinary changes  Physical Exam  BP (!) 148/96 (BP Location: Left Arm)   Pulse (!) 122   Temp 97.8 F (36.6 C) (Oral)   Resp 18   SpO2 99%  Gen:   Awake, no distress   Resp:  Normal effort  MSK:   Moves extremities without difficulty  Other:    Medical Decision Making  Medically screening exam initiated at 3:57 PM.  Appropriate orders placed.  Carrye Schlotzhauer was informed that the remainder of the evaluation will be completed by another provider, this initial triage assessment does not replace that evaluation, and the importance of remaining in the ED until their evaluation is complete.  URI symptoms. Chest xray and flu/covid symptoms   Darletta Moll, PA-C 12/09/21 1557

## 2023-12-09 ENCOUNTER — Encounter
